# Patient Record
Sex: Female | Born: 1970 | Race: Black or African American | Hispanic: No | Marital: Single | Smoking: Former smoker
Health system: Southern US, Community
[De-identification: ages and names within clinical notes are randomized; demographics above are authoritative.]

## PROBLEM LIST (undated history)

## (undated) DIAGNOSIS — E119 Type 2 diabetes mellitus without complications: Secondary | ICD-10-CM

## (undated) DIAGNOSIS — I10 Essential (primary) hypertension: Secondary | ICD-10-CM

## (undated) DIAGNOSIS — E7800 Pure hypercholesterolemia, unspecified: Secondary | ICD-10-CM

## (undated) HISTORY — PX: ABDOMINAL HYSTERECTOMY: SHX81

---

## 2012-12-13 ENCOUNTER — Encounter (HOSPITAL_COMMUNITY): Payer: Self-pay | Admitting: Emergency Medicine

## 2012-12-13 ENCOUNTER — Emergency Department (HOSPITAL_COMMUNITY): Payer: 59

## 2012-12-13 ENCOUNTER — Emergency Department (HOSPITAL_COMMUNITY)
Admission: EM | Admit: 2012-12-13 | Discharge: 2012-12-13 | Disposition: A | Discharge status: Home / Self Care | Payer: 59 | Attending: Emergency Medicine | Admitting: Emergency Medicine

## 2012-12-13 DIAGNOSIS — R1013 Epigastric pain: Secondary | ICD-10-CM | POA: Insufficient documentation

## 2012-12-13 HISTORY — DX: Pure hypercholesterolemia, unspecified: E78.00

## 2012-12-13 HISTORY — DX: Type 2 diabetes mellitus without complications: E11.9

## 2012-12-13 HISTORY — DX: Essential (primary) hypertension: I10

## 2012-12-13 LAB — COMPREHENSIVE METABOLIC PANEL
ALT: 13 U/L (ref 0–35)
AST: 12 U/L (ref 0–37)
Albumin: 3.8 g/dL (ref 3.5–5.2)
Alkaline Phosphatase: 54 U/L (ref 39–117)
BUN: 13 mg/dL (ref 6–23)
CO2: 28 mEq/L (ref 19–32)
Calcium: 9.6 mg/dL (ref 8.4–10.5)
Chloride: 94 mEq/L — ABNORMAL LOW (ref 96–112)
Creatinine, Ser: 0.66 mg/dL (ref 0.50–1.10)
GFR calc Af Amer: >90 mL/min (ref >90)
GFR calc non Af Amer: >90 mL/min (ref >90)
Glucose, Bld: 249 mg/dL — ABNORMAL HIGH (ref 70–99)
Potassium: 3.7 mEq/L (ref 3.5–5.1)
Sodium: 134 mEq/L — ABNORMAL LOW (ref 135–145)
Total Bilirubin: 0.3 mg/dL (ref 0.3–1.2)
Total Protein: 7.2 g/dL (ref 6.0–8.3)

## 2012-12-13 LAB — CBC WITH DIFFERENTIAL/PLATELET
Basophils Absolute: 0.1 10*3/uL (ref 0.0–0.1)
Basophils Relative: 0 % (ref 0–1)
Eosinophils Absolute: 0.2 10*3/uL (ref 0.0–0.7)
Eosinophils Relative: 2 % (ref 0–5)
HCT: 40 % (ref 36.0–46.0)
Hemoglobin: 13.5 g/dL (ref 12.0–15.0)
Lymphocytes Relative: 39 % (ref 12–46)
Lymphs Abs: 4.4 10*3/uL — ABNORMAL HIGH (ref 0.7–4.0)
MCH: 28.3 pg (ref 26.0–34.0)
MCHC: 33.8 g/dL (ref 30.0–36.0)
MCV: 83.9 fL (ref 78.0–100.0)
Monocytes Absolute: 0.7 10*3/uL (ref 0.1–1.0)
Monocytes Relative: 6 % (ref 3–12)
Neutro Abs: 6 10*3/uL (ref 1.7–7.7)
Neutrophils Relative %: 53 % (ref 43–77)
Platelets: 379 10*3/uL (ref 150–400)
RBC: 4.77 MIL/uL (ref 3.87–5.11)
RDW: 13.7 % (ref 11.5–15.5)
WBC: 11.2 10*3/uL — ABNORMAL HIGH (ref 4.0–10.5)

## 2012-12-13 LAB — POCT I-STAT TROPONIN I
Troponin i, poc: 0 ng/mL (ref 0.00–0.08)
Troponin i, poc: 0 ng/mL (ref 0.00–0.08)

## 2012-12-13 LAB — LIPASE, BLOOD: Lipase: 27 U/L (ref 11–59)

## 2012-12-13 MED ORDER — OMEPRAZOLE 20 MG PO CPDR: 20.0000 mg | DELAYED_RELEASE_CAPSULE | Freq: Every day | ORAL | Status: AC

## 2012-12-13 MED ORDER — ASPIRIN 325 MG PO TABS
325.0000 mg | ORAL_TABLET | Freq: Once | ORAL | Status: AC
  Administered 2012-12-13: 325 mg via ORAL
  Filled 2012-12-13: qty 1

## 2012-12-13 NOTE — ED Notes (Signed)
Patient claims burning in epigastric area x 3 days.  Patient claims has tried pepto and gas-x at home, but no relief.   Patient claims that it has been intermittent.

## 2012-12-13 NOTE — ED Provider Notes (Signed)
History     CSN: [id number]  Arrival date & time 12/13/12  0755   None     No chief complaint on file.    HPI chief complaint: Epigastric pain. Onset: 3 days ago. Location: Epigastric. Worsened with some spicy or fatty foods and improved with milk and baking soda water. Quality: Burning. Severity: Mild. Timing: Intermittent. 6 episodes a day. Duration: Several minutes each episode. For signs and symptoms to review of systems. Social history: See nurse's Notes. Patient does not smoke. No family history of myocardial infarction. I have reviewed patient's past medical, past surgical, social history as well as medications and allergies per  No past medical history on file.  No past surgical history on file.  No family history on file.  History  Substance Use Topics  . Smoking status: Not on file  . Smokeless tobacco: Not on file  . Alcohol Use: Not on file    OB History    No data available      Review of Systems  Constitutional: Negative for fever and chills.  HENT: Negative for sore throat, trouble swallowing, neck pain and neck stiffness.   Eyes: Negative for pain, discharge and itching.  Respiratory: Negative for cough, chest tightness and shortness of breath.   Cardiovascular: Negative for chest pain, palpitations and leg swelling.  Gastrointestinal: Positive for abdominal pain. Negative for nausea, vomiting, diarrhea, constipation and blood in stool.       Burning in her throat.  Genitourinary: Negative for dysuria, urgency, frequency, hematuria, flank pain, decreased urine volume, difficulty urinating and pelvic pain.  Musculoskeletal: Negative for back pain and joint swelling.  Skin: Negative for rash and wound.  Neurological: Negative for dizziness, tremors, seizures, syncope, facial asymmetry, speech difficulty, weakness, light-headedness, numbness and headaches.  Hematological: Negative for adenopathy. Does not bruise/bleed easily.  Psychiatric/Behavioral: Negative  for confusion and decreased concentration.    Allergies  Review of patient's allergies indicates not on file.  Home Medications  No current outpatient prescriptions on file.  There were no vitals taken for this visit.  Physical Exam  Constitutional: She is oriented to person, place, and time. She appears well-developed and well-nourished. No distress.  HENT:  Head: Normocephalic and atraumatic.  Eyes: Conjunctivae normal are normal. Right eye exhibits no discharge. Left eye exhibits no discharge. No scleral icterus.  Neck: Normal range of motion. Neck supple. No JVD present.  Cardiovascular: Normal rate, regular rhythm, normal heart sounds and intact distal pulses.   No murmur heard. Pulmonary/Chest: Effort normal and breath sounds normal. No respiratory distress. She has no wheezes. She has no rales. She exhibits no tenderness.  Abdominal: Soft. Bowel sounds are normal. She exhibits no distension and no mass. There is no tenderness. There is no rebound and no guarding.  Musculoskeletal: Normal range of motion. She exhibits no edema and no tenderness.  Neurological: She is alert and oriented to person, place, and time.  Skin: Skin is warm and dry. She is not diaphoretic.  Psychiatric: She has a normal mood and affect.    ED Course  Procedures (including critical care time)  Labs Reviewed  CBC WITH DIFFERENTIAL - Abnormal; Notable for the following:    WBC 11.2 (*)     Lymphs Abs 4.4 (*)     All other components within normal limits  COMPREHENSIVE METABOLIC PANEL - Abnormal; Notable for the following:    Sodium 134 (*)     Chloride 94 (*)     Glucose, Bld 249 (*)  All other components within normal limits  LIPASE, BLOOD  POCT I-STAT TROPONIN I  POCT I-STAT TROPONIN I   Dg Chest 2 View  12/13/2012  *RADIOLOGY REPORT*  Clinical Data: Chest pain  CHEST - 2 VIEW  Comparison: None.  Findings: Lungs clear.  Heart size and pulmonary vascularity are normal.  No adenopathy.  No  bone lesions.  IMPRESSION: No abnormality noted.   Original Report Authenticated By: Bretta Bang, M.D.      1. Epigastric burning sensation     Right upper quadrant bedside ultrasound: No evidence of gallbladder wall thickening, pericholecystic fluid, gallstones or shadowing. No sonographic Murphy sign.  EKG reviewed and interpreted: Sinus rhythm rate 77. Normal axis. Normal intervals. Nonspecific T-wave changes in the inferior leads. No ST segment changes. MDM  Patient is a 42 year old obese female with history of hypertension hypercholesterolemia and diabetes presenting with intermittent episodes of epigastric pain with burning in her chest. Improved with milk and baking soda but not with peptio. Not positional. Denies chest pain otherwise. Completely asymptomatic at this time. Patient's symptoms likely secondary to reflux and given diabetic and she has a few cardiac risk factors the initial troponin was sent which was negative. We'll plan for a second troponin at 11:00 which will been more than 6 hours since her last onset. If negative I feel patient is low risk for acute coronary syndrome and that she can followup with regular doctor. We'll discharge with a PPI.        Consuello Masse, MD 12/13/12 [phone]

## 2012-12-13 NOTE — ED Notes (Signed)
Care given and report given to [place], [place].

## 2012-12-13 NOTE — ED Provider Notes (Signed)
I saw and evaluated the patient, reviewed the resident's note and I agree with the findings and plan.   I reviewed and agree with Dr. Zachery Dakins ECG interpretation.  Pt with very atypical symptoms, sharp pain across upper epigastrium and then burning up into chest, radiation, lasting several minutes.  Sometimes improved with antacids.  She reports eating may be no change or seems to make pain improved, never worse.  No sweats, dizziness.  No sig SOB, pleurisy.     Work up neg in the ED.  U/S done.  Troponin, serially, are neg.  Will refer to PCP and to GI, recommend daily PPI.  Gavin Pound. Oletta Lamas, MD 12/14/12 [id number]

## 2014-08-24 ENCOUNTER — Other Ambulatory Visit: Payer: Self-pay | Admitting: Obstetrics and Gynecology

## 2014-08-24 DIAGNOSIS — Z1231 Encounter for screening mammogram for malignant neoplasm of breast: Secondary | ICD-10-CM

## 2014-09-13 ENCOUNTER — Ambulatory Visit
Admission: RE | Admit: 2014-09-13 | Discharge: 2014-09-13 | Disposition: A | Discharge status: Home / Self Care | Payer: Federal, State, Local not specified - PPO | Source: Ambulatory Visit | Attending: Obstetrics and Gynecology | Admitting: Obstetrics and Gynecology

## 2014-09-13 DIAGNOSIS — Z1231 Encounter for screening mammogram for malignant neoplasm of breast: Secondary | ICD-10-CM

## 2014-09-17 ENCOUNTER — Other Ambulatory Visit: Payer: Self-pay | Admitting: Obstetrics and Gynecology

## 2014-09-17 DIAGNOSIS — R928 Other abnormal and inconclusive findings on diagnostic imaging of breast: Secondary | ICD-10-CM

## 2014-10-03 ENCOUNTER — Ambulatory Visit
Admission: RE | Admit: 2014-10-03 | Discharge: 2014-10-03 | Disposition: A | Discharge status: Home / Self Care | Payer: Federal, State, Local not specified - PPO | Source: Ambulatory Visit | Attending: Obstetrics and Gynecology | Admitting: Obstetrics and Gynecology

## 2014-10-03 DIAGNOSIS — R928 Other abnormal and inconclusive findings on diagnostic imaging of breast: Secondary | ICD-10-CM

## 2016-03-08 IMAGING — MG MM SCREENING BREAST TOMO BILATERAL
8 series · 8 of 24 positions shown · non-contrast
Comparison: None

CLINICAL DATA: Screening.

EXAM:
DIGITAL SCREENING BILATERAL MAMMOGRAM WITH 3D TOMO WITH CAD

[R MLO]
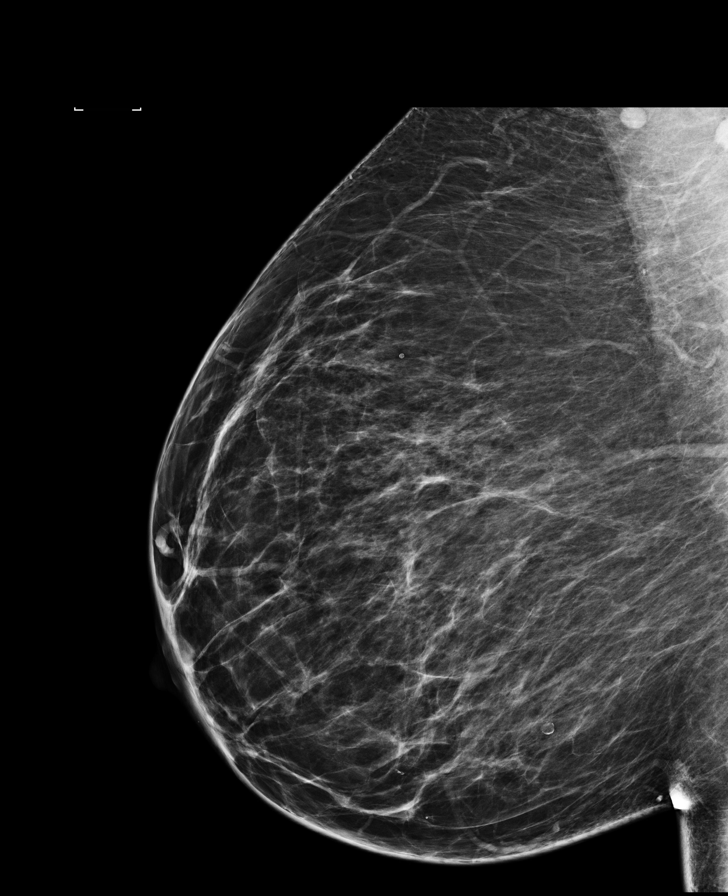

[R CC]
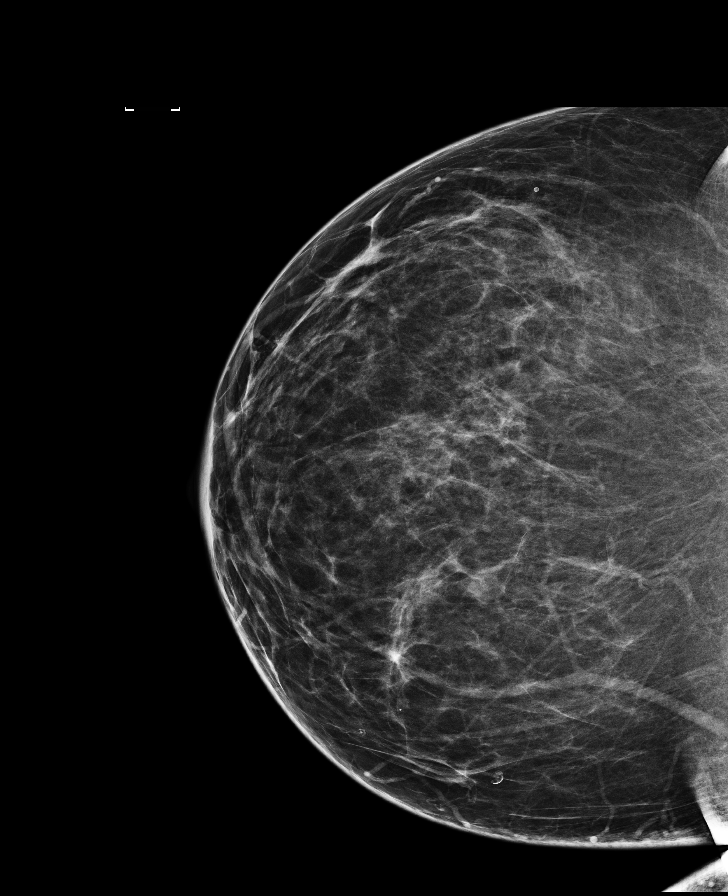

[L CC]
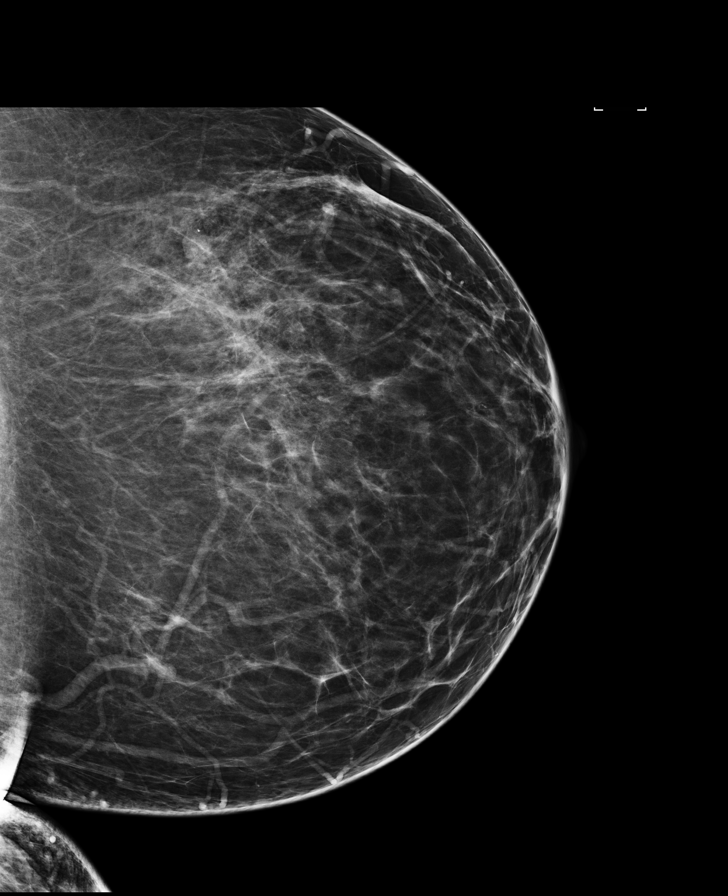

[L MLO]
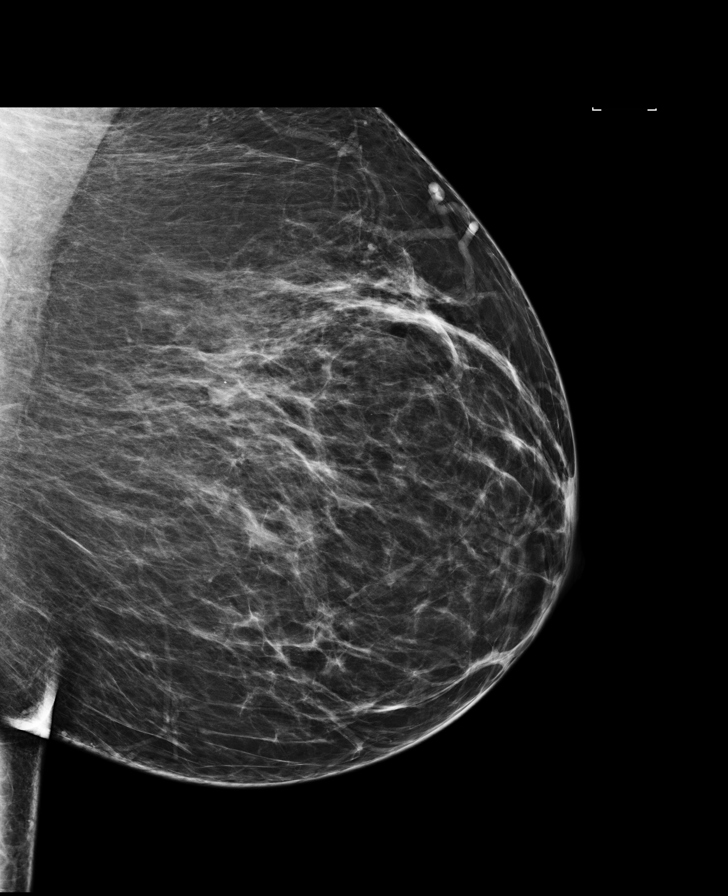

[L MLO tomo · tomo slice 48/95.0]
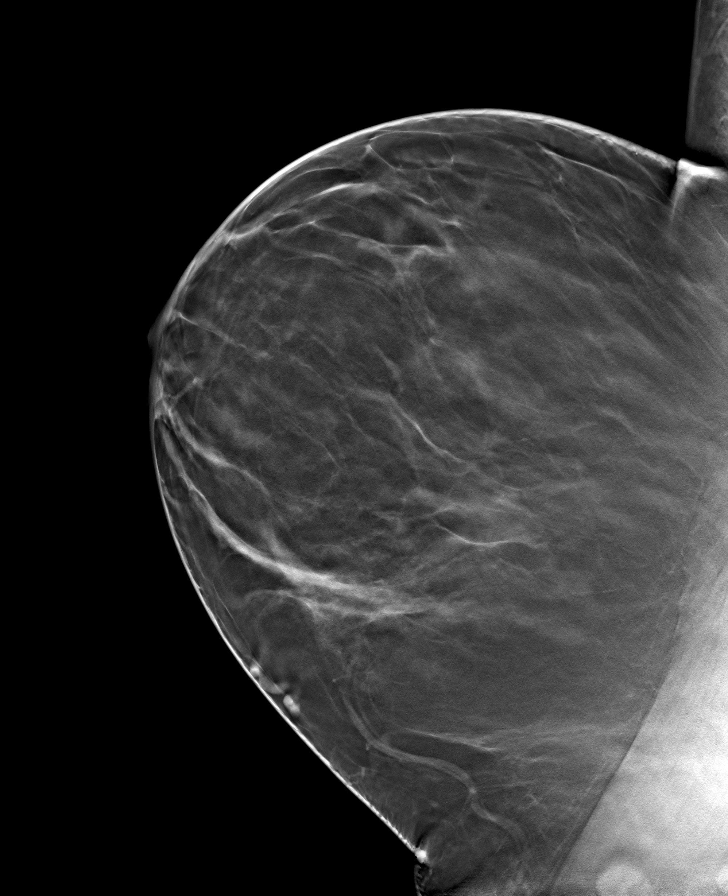

[R MLO tomo · tomo slice 47/92.0]
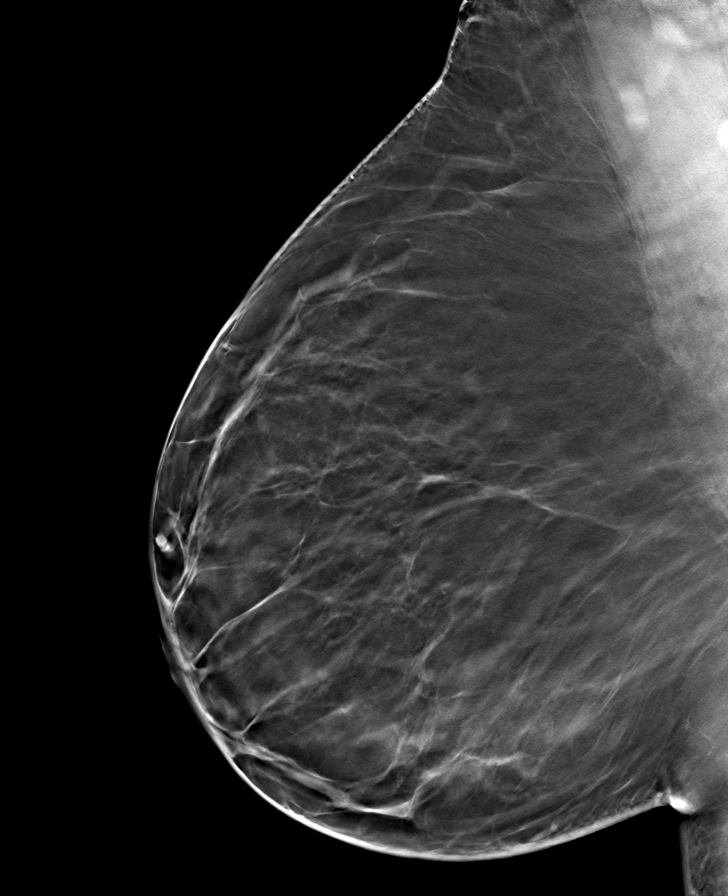

[L CC tomo · tomo slice 45/88.0]
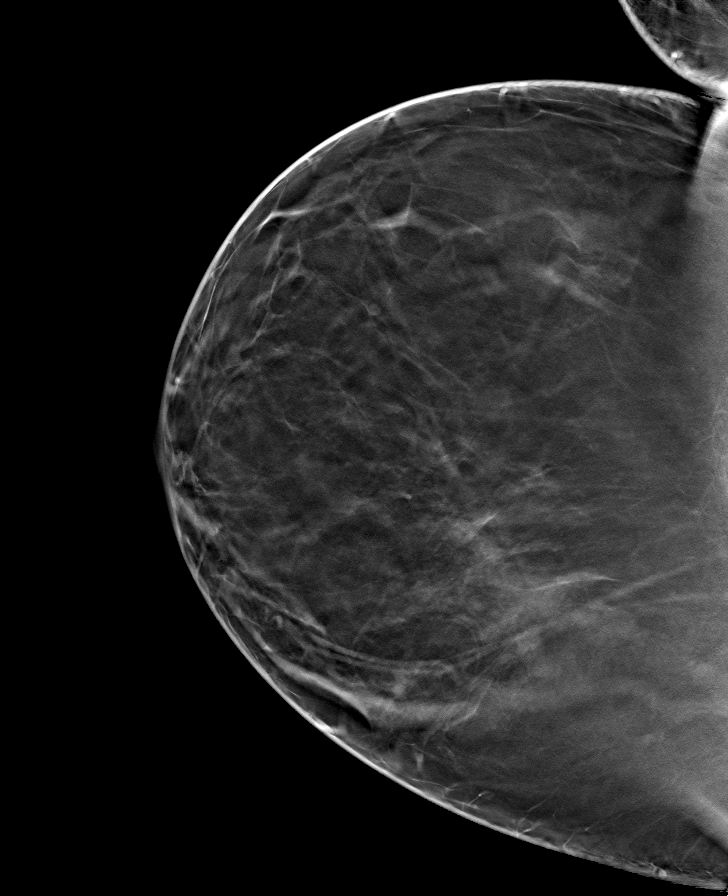

[R CC tomo · tomo slice 43/86.0]
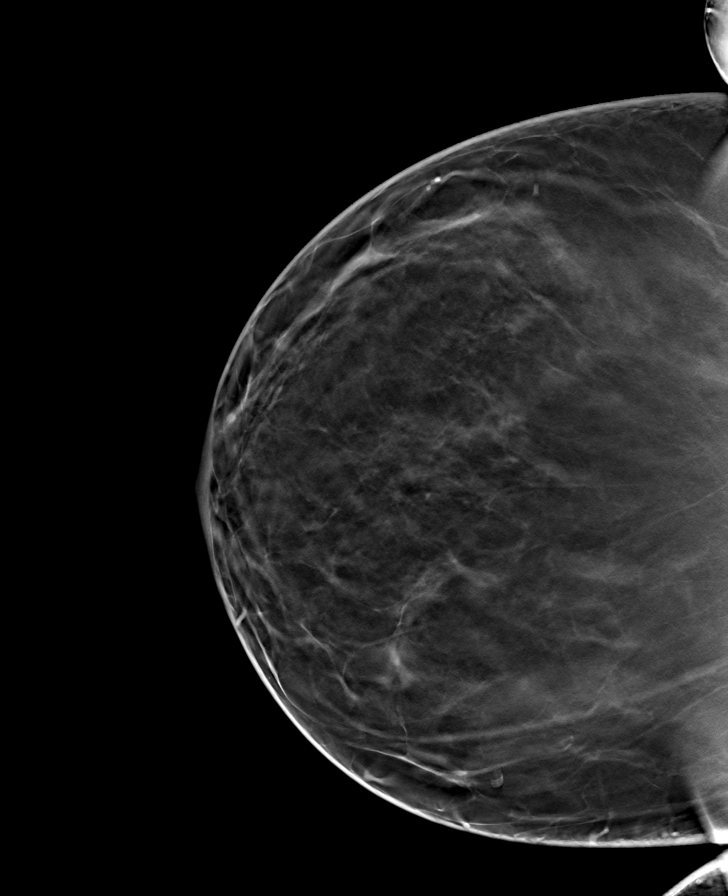

[8 of 24 positions shown; findings below may reference images not displayed]

ACR Breast Density Category b: There are scattered areas of
fibroglandular density.
FINDINGS: In the left breast, a possible mass warrants further evaluation with
spot compression views and possibly ultrasound. In the right breast,
no findings suspicious for malignancy.

Images were processed with CAD.
IMPRESSION: Further evaluation is suggested for possible mass in the left
breast.

RECOMMENDATION:
Diagnostic mammogram and possibly ultrasound of the left breast.
(Code:[id number])

The patient will be contacted regarding the findings, and additional
imaging will be scheduled.

BI-RADS CATEGORY  0: Incomplete. Need additional imaging evaluation
and/or prior mammograms for comparison.

## 2016-03-17 DIAGNOSIS — E669 Obesity, unspecified: Secondary | ICD-10-CM | POA: Diagnosis not present

## 2016-03-17 DIAGNOSIS — I10 Essential (primary) hypertension: Secondary | ICD-10-CM | POA: Diagnosis not present

## 2016-03-17 DIAGNOSIS — E119 Type 2 diabetes mellitus without complications: Secondary | ICD-10-CM | POA: Diagnosis not present

## 2016-09-01 DIAGNOSIS — E119 Type 2 diabetes mellitus without complications: Secondary | ICD-10-CM | POA: Diagnosis not present

## 2016-09-01 DIAGNOSIS — I10 Essential (primary) hypertension: Secondary | ICD-10-CM | POA: Diagnosis not present

## 2016-09-01 DIAGNOSIS — E559 Vitamin D deficiency, unspecified: Secondary | ICD-10-CM | POA: Diagnosis not present

## 2016-09-01 DIAGNOSIS — M2550 Pain in unspecified joint: Secondary | ICD-10-CM | POA: Diagnosis not present

## 2016-09-01 DIAGNOSIS — E669 Obesity, unspecified: Secondary | ICD-10-CM | POA: Diagnosis not present

## 2017-01-05 DIAGNOSIS — E119 Type 2 diabetes mellitus without complications: Secondary | ICD-10-CM | POA: Diagnosis not present

## 2017-01-05 DIAGNOSIS — I10 Essential (primary) hypertension: Secondary | ICD-10-CM | POA: Diagnosis not present

## 2017-01-05 DIAGNOSIS — E669 Obesity, unspecified: Secondary | ICD-10-CM | POA: Diagnosis not present

## 2017-01-05 DIAGNOSIS — E7800 Pure hypercholesterolemia, unspecified: Secondary | ICD-10-CM | POA: Diagnosis not present

## 2017-02-15 DIAGNOSIS — E669 Obesity, unspecified: Secondary | ICD-10-CM | POA: Diagnosis not present

## 2017-02-15 DIAGNOSIS — E119 Type 2 diabetes mellitus without complications: Secondary | ICD-10-CM | POA: Diagnosis not present

## 2017-02-15 DIAGNOSIS — E7800 Pure hypercholesterolemia, unspecified: Secondary | ICD-10-CM | POA: Diagnosis not present

## 2017-02-15 DIAGNOSIS — I10 Essential (primary) hypertension: Secondary | ICD-10-CM | POA: Diagnosis not present

## 2017-06-23 DIAGNOSIS — I10 Essential (primary) hypertension: Secondary | ICD-10-CM | POA: Diagnosis not present

## 2017-06-23 DIAGNOSIS — E669 Obesity, unspecified: Secondary | ICD-10-CM | POA: Diagnosis not present

## 2017-06-23 DIAGNOSIS — M2550 Pain in unspecified joint: Secondary | ICD-10-CM | POA: Diagnosis not present

## 2017-06-23 DIAGNOSIS — E7800 Pure hypercholesterolemia, unspecified: Secondary | ICD-10-CM | POA: Diagnosis not present

## 2017-06-23 DIAGNOSIS — E288 Other ovarian dysfunction: Secondary | ICD-10-CM | POA: Diagnosis not present

## 2017-06-23 DIAGNOSIS — E119 Type 2 diabetes mellitus without complications: Secondary | ICD-10-CM | POA: Diagnosis not present

## 2017-10-13 DIAGNOSIS — N390 Urinary tract infection, site not specified: Secondary | ICD-10-CM | POA: Diagnosis not present

## 2017-10-13 DIAGNOSIS — E669 Obesity, unspecified: Secondary | ICD-10-CM | POA: Diagnosis not present

## 2017-10-13 DIAGNOSIS — E7800 Pure hypercholesterolemia, unspecified: Secondary | ICD-10-CM | POA: Diagnosis not present

## 2017-10-13 DIAGNOSIS — E119 Type 2 diabetes mellitus without complications: Secondary | ICD-10-CM | POA: Diagnosis not present

## 2017-10-13 DIAGNOSIS — I10 Essential (primary) hypertension: Secondary | ICD-10-CM | POA: Diagnosis not present

## 2018-03-10 DIAGNOSIS — E782 Mixed hyperlipidemia: Secondary | ICD-10-CM | POA: Diagnosis not present

## 2018-03-10 DIAGNOSIS — Z202 Contact with and (suspected) exposure to infections with a predominantly sexual mode of transmission: Secondary | ICD-10-CM | POA: Diagnosis not present

## 2018-03-10 DIAGNOSIS — R61 Generalized hyperhidrosis: Secondary | ICD-10-CM | POA: Diagnosis not present

## 2018-03-10 DIAGNOSIS — Z0000 Encounter for general adult medical examination without abnormal findings: Secondary | ICD-10-CM | POA: Diagnosis not present

## 2018-03-10 DIAGNOSIS — N390 Urinary tract infection, site not specified: Secondary | ICD-10-CM | POA: Diagnosis not present

## 2018-03-10 DIAGNOSIS — Z23 Encounter for immunization: Secondary | ICD-10-CM | POA: Diagnosis not present

## 2018-03-10 DIAGNOSIS — E119 Type 2 diabetes mellitus without complications: Secondary | ICD-10-CM | POA: Diagnosis not present

## 2018-03-10 DIAGNOSIS — F419 Anxiety disorder, unspecified: Secondary | ICD-10-CM | POA: Diagnosis not present

## 2018-03-10 DIAGNOSIS — I10 Essential (primary) hypertension: Secondary | ICD-10-CM | POA: Diagnosis not present

## 2018-06-08 DIAGNOSIS — R7989 Other specified abnormal findings of blood chemistry: Secondary | ICD-10-CM | POA: Diagnosis not present

## 2018-06-08 DIAGNOSIS — I10 Essential (primary) hypertension: Secondary | ICD-10-CM | POA: Diagnosis not present

## 2018-06-08 DIAGNOSIS — F419 Anxiety disorder, unspecified: Secondary | ICD-10-CM | POA: Diagnosis not present

## 2018-06-08 DIAGNOSIS — E782 Mixed hyperlipidemia: Secondary | ICD-10-CM | POA: Diagnosis not present

## 2018-06-08 DIAGNOSIS — E119 Type 2 diabetes mellitus without complications: Secondary | ICD-10-CM | POA: Diagnosis not present

## 2019-07-08 DIAGNOSIS — N760 Acute vaginitis: Secondary | ICD-10-CM | POA: Diagnosis not present

## 2019-09-26 DIAGNOSIS — Z6841 Body Mass Index (BMI) 40.0 and over, adult: Secondary | ICD-10-CM | POA: Diagnosis not present

## 2019-09-26 DIAGNOSIS — I10 Essential (primary) hypertension: Secondary | ICD-10-CM | POA: Diagnosis not present

## 2019-09-26 DIAGNOSIS — E1165 Type 2 diabetes mellitus with hyperglycemia: Secondary | ICD-10-CM | POA: Diagnosis not present

## 2019-09-26 DIAGNOSIS — Z1231 Encounter for screening mammogram for malignant neoplasm of breast: Secondary | ICD-10-CM | POA: Diagnosis not present

## 2019-11-02 DIAGNOSIS — I10 Essential (primary) hypertension: Secondary | ICD-10-CM | POA: Diagnosis not present

## 2019-11-02 DIAGNOSIS — E1165 Type 2 diabetes mellitus with hyperglycemia: Secondary | ICD-10-CM | POA: Diagnosis not present

## 2019-11-02 DIAGNOSIS — Z6841 Body Mass Index (BMI) 40.0 and over, adult: Secondary | ICD-10-CM | POA: Diagnosis not present
